# Patient Record
Sex: Female | Born: 2000 | Hispanic: Yes | Marital: Single | Smoking: Never smoker
Health system: Southern US, Community
[De-identification: ages and names within clinical notes are randomized; demographics above are authoritative.]

## PROBLEM LIST (undated history)

## (undated) DIAGNOSIS — D649 Anemia, unspecified: Secondary | ICD-10-CM

---

## 2022-04-14 ENCOUNTER — Emergency Department: Payer: Self-pay

## 2022-04-14 ENCOUNTER — Other Ambulatory Visit: Payer: Self-pay

## 2022-04-14 ENCOUNTER — Emergency Department
Admission: EM | Admit: 2022-04-14 | Discharge: 2022-04-14 | Disposition: A | Discharge status: Home / Self Care | Payer: Self-pay | Attending: Emergency Medicine | Admitting: Emergency Medicine

## 2022-04-14 DIAGNOSIS — O4690 Antepartum hemorrhage, unspecified, unspecified trimester: Secondary | ICD-10-CM

## 2022-04-14 DIAGNOSIS — R1030 Lower abdominal pain, unspecified: Secondary | ICD-10-CM | POA: Insufficient documentation

## 2022-04-14 DIAGNOSIS — M545 Low back pain, unspecified: Secondary | ICD-10-CM | POA: Insufficient documentation

## 2022-04-14 DIAGNOSIS — N898 Other specified noninflammatory disorders of vagina: Secondary | ICD-10-CM | POA: Insufficient documentation

## 2022-04-14 DIAGNOSIS — R8271 Bacteriuria: Secondary | ICD-10-CM

## 2022-04-14 DIAGNOSIS — Z3A01 Less than 8 weeks gestation of pregnancy: Secondary | ICD-10-CM | POA: Insufficient documentation

## 2022-04-14 DIAGNOSIS — O209 Hemorrhage in early pregnancy, unspecified: Secondary | ICD-10-CM | POA: Insufficient documentation

## 2022-04-14 LAB — CBC WITH DIFFERENTIAL/PLATELET
Abs Immature Granulocytes: 0.02 10*3/uL (ref 0.00–0.07)
Basophils Absolute: 0.1 10*3/uL (ref 0.0–0.1)
Basophils Relative: 1 %
Eosinophils Absolute: 0.1 10*3/uL (ref 0.0–0.5)
Eosinophils Relative: 1 %
HCT: 39.3 % (ref 36.0–46.0)
Hemoglobin: 12.8 g/dL (ref 12.0–15.0)
Immature Granulocytes: 0 %
Lymphocytes Relative: 28 %
Lymphs Abs: 2 10*3/uL (ref 0.7–4.0)
MCH: 28.2 pg (ref 26.0–34.0)
MCHC: 32.6 g/dL (ref 30.0–36.0)
MCV: 86.6 fL (ref 80.0–100.0)
Monocytes Absolute: 0.6 10*3/uL (ref 0.1–1.0)
Monocytes Relative: 8 %
Neutro Abs: 4.3 10*3/uL (ref 1.7–7.7)
Neutrophils Relative %: 62 %
Platelets: 283 10*3/uL (ref 150–400)
RBC: 4.54 MIL/uL (ref 3.87–5.11)
RDW: 13 % (ref 11.5–15.5)
WBC: 7 10*3/uL (ref 4.0–10.5)
nRBC: 0 % (ref 0.0–0.2)

## 2022-04-14 LAB — URINALYSIS, COMPLETE (UACMP) WITH MICROSCOPIC
Bilirubin Urine: NEGATIVE
Glucose, UA: NEGATIVE mg/dL
Ketones, ur: NEGATIVE mg/dL
Nitrite: NEGATIVE
Protein, ur: NEGATIVE mg/dL
Specific Gravity, Urine: 1.024 (ref 1.005–1.030)
pH: 6 (ref 5.0–8.0)

## 2022-04-14 LAB — BASIC METABOLIC PANEL
Anion gap: 8 (ref 5–15)
BUN: 10 mg/dL (ref 6–20)
CO2: 24 mmol/L (ref 22–32)
Calcium: 9.2 mg/dL (ref 8.9–10.3)
Chloride: 106 mmol/L (ref 98–111)
Creatinine, Ser: 0.56 mg/dL (ref 0.44–1.00)
GFR, Estimated: >60 mL/min (ref >60)
Glucose, Bld: 94 mg/dL (ref 70–99)
Potassium: 3.6 mmol/L (ref 3.5–5.1)
Sodium: 138 mmol/L (ref 135–145)

## 2022-04-14 LAB — ABO/RH: ABO/RH(D): B POS

## 2022-04-14 LAB — POC URINE PREG, ED: Preg Test, Ur: POSITIVE — AB

## 2022-04-14 LAB — CHLAMYDIA/NGC RT PCR (ARMC ONLY)
Chlamydia Tr: NOT DETECTED
N gonorrhoeae: NOT DETECTED

## 2022-04-14 LAB — HCG, QUANTITATIVE, PREGNANCY: hCG, Beta Chain, Quant, S: 3479 m[IU]/mL — ABNORMAL HIGH (ref <5)

## 2022-04-14 MED ORDER — CEPHALEXIN 500 MG PO CAPS: 500.0000 mg | ORAL_CAPSULE | Freq: Four times a day (QID) | ORAL | 0 refills | Status: AC

## 2022-04-14 NOTE — ED Provider Notes (Addendum)
[facility] Provider Note    Event Date/Time   First MD Initiated Contact with Patient 04/14/22 1213     (approximate)   History   Vaginal Bleeding   HPI  Theresa Solomon is a 21 y.o. female G1, P0 with last LMP on 511 who presents for evaluation of 1 week of some crampy lower abdominal pain and bilateral crampy low back pain associate with some intermittent bleeding occasionally spotting and occasionally passage of some clots.  Patient states she is not going through pads that often.  She denies any headache area, sore throat, fevers, chest pain, cough shortness of breath or any other abnormal vaginal discharge or burning with urination.  No history of any recent trauma or injuries.  She does not take any medications and denies any EtOH use with drug use or tobacco abuse.  She is declining any analgesia at this time.  She has not seen an OB/GYN for this pregnancy.      Physical Exam  Triage Vital Signs: ED Triage Vitals  Enc Vitals Group     BP 04/14/22 1150 112/77     Pulse Rate 04/14/22 1150 82     Resp 04/14/22 1150 16     Temp 04/14/22 1150 98.6 F (37 C)     Temp Source 04/14/22 1150 Oral     SpO2 04/14/22 1150 97 %     Weight 04/14/22 1147 130 lb (59 kg)     Height 04/14/22 1147 5\' 4"  (1.626 m)     Head Circumference --      Peak Flow --      Pain Score 04/14/22 1146 5     Pain Loc --      Pain Edu? --      Excl. in GC? --     Most recent vital signs: Vitals:   04/14/22 1150  BP: 112/77  Pulse: 82  Resp: 16  Temp: 98.6 F (37 C)  SpO2: 97%    General: Awake, no distress.  CV:  Good peripheral perfusion.  Resp:  Normal effort.  Abd:  No distention.  Soft throughout. Other:  No significant CVA tenderness.   ED Results / Procedures / Treatments  Labs (all labs ordered are listed, but only abnormal results are displayed) Labs Reviewed  URINALYSIS, COMPLETE (UACMP) WITH MICROSCOPIC - Abnormal; Notable for the following  components:      Result Value   Color, Urine YELLOW (*)    APPearance CLOUDY (*)    Hgb urine dipstick SMALL (*)    Leukocytes,Ua MODERATE (*)    Bacteria, UA RARE (*)    All other components within normal limits  HCG, QUANTITATIVE, PREGNANCY - Abnormal; Notable for the following components:   hCG, Beta Chain, Quant, S 3,479 (*)    All other components within normal limits  POC URINE PREG, ED - Abnormal; Notable for the following components:   Preg Test, Ur POSITIVE (*)    All other components within normal limits  CHLAMYDIA/NGC RT PCR ([facility] ONLY)            URINE CULTURE  CBC WITH DIFFERENTIAL/PLATELET  BASIC METABOLIC PANEL  ABO/RH     EKG    RADIOLOGY Pelvic ultrasound my interpretation shows a gestational sac with cannot clearly see a fetal pole or yolk sac.  I reviewed radiologist interpretation and agree their findings of a single gestational sac estimated gestational age of [redacted] weeks 2 days based on his sac diameter but  no fetal pole or yolk sac.  There is also notation of peripheral oriented follicles in both ovaries without other acute process i.e. evidence of subchorionic hemorrhage.  PROCEDURES:  Critical Care performed: No  Procedures    MEDICATIONS ORDERED IN ED: Medications - No data to display   IMPRESSION / MDM / ASSESSMENT AND PLAN / ED COURSE  I reviewed the triage vital signs and the nursing notes. Patient's presentation is most consistent with acute presentation with potential threat to life or bodily function.                               Differential diagnosis includes, but is not limited to miscarriage, subchorionic hemorrhage, ectopic pregnancy, possible cystitis.  CBC without leukocytosis or acute anemia.  BMP shows no significant electrolyte or metabolic derangements.  Rh+ and no indication for RhoGAM at this time.  UA has moderate leukocyte esterase and 11-20 WBCs with some rare bacteria noted.  Unclear if this represents possible cystitis as  there does appear to be some contamination with squamous epithelial cells as well.  hCG is 3479.  Urine GC studies are negative.  Pelvic ultrasound my interpretation shows a gestational sac with cannot clearly see a fetal pole or yolk sac.  I reviewed radiologist interpretation and agree their findings of a single gestational sac estimated gestational age of [redacted] weeks 2 days based on his sac diameter but no fetal pole or yolk sac.  There is also notation of peripheral oriented follicles in both ovaries without other acute process i.e. evidence of subchorionic hemorrhage.  I offered patient a pelvic exam prior to obtaining urine GC studies but patient is declining this stating she does not wish to get this at all in the emergency room even after explaining high sensitivity with swab for GC studies as well as trichomoniasis and yeast and BV.  She states she prefers to get this with her OB/GYN at her follow-up appointment.  Discussed that she has a somewhat indeterminate ultrasound at this time and that she must get repeat ultrasound and hCG levels repeated at 48 hours from now.  If she cannot get into see her OB/GYN who she is trying to get in an appointment with at [facility] she must return to emergency room.  She does not want a referral for a local OB/GYN.  We will treat her bacteria in her urine as possible asymptomatic bacteriuria urea pregnancy with some Keflex at this time.  She has no other acute concerns.  Given she is declining pelvic exam further testing will defer this to her OB.  Discharged in stable condition.  Strict return precautions advised and discussed.    FINAL CLINICAL IMPRESSION(S) / ED DIAGNOSES   Final diagnoses:  Vaginal bleeding in pregnancy  Bacteriuria     Rx / DC Orders   ED Discharge Orders          Ordered    cephALEXin (KEFLEX) 500 MG capsule  4 times daily        04/14/22 1443             Note:  This document was prepared using Dragon voice recognition software  and may include unintentional dictation errors.   Gilles Chiquito, MD 04/14/22 1445    Gilles Chiquito, MD 04/14/22 [phone]

## 2022-04-14 NOTE — ED Triage Notes (Signed)
Pt to ED with partner, pt complains if intermittent vaginal bleeding for 1 week, positive UA home test 2 weeks ago, LMP was 03/08/22. States bleeding is reddish-brown with flat quarter size clots. Also mild lower back pain and lower abdominal cramping since 1 week.

## 2022-04-14 NOTE — Discharge Instructions (Signed)
As we discussed it is vital you have repeat hCG levels checked and a repeat ultrasound in 48 hours.  Also as we discussed your gonorrhea and Chlamydia testing is in process and you can follow this result up on MyChart since you do not wish to wait for the result emergency room.  If it is positive you will require treatment and must return to the emergency room or be seen by your doctor.

## 2022-04-16 LAB — URINE CULTURE: Culture: 20000 — AB

## 2022-12-22 ENCOUNTER — Emergency Department: Payer: 59

## 2022-12-22 ENCOUNTER — Other Ambulatory Visit: Payer: Self-pay

## 2022-12-22 ENCOUNTER — Emergency Department
Admission: EM | Admit: 2022-12-22 | Discharge: 2022-12-22 | Disposition: A | Discharge status: Home / Self Care | Payer: 59 | Attending: Emergency Medicine | Admitting: Emergency Medicine

## 2022-12-22 DIAGNOSIS — D649 Anemia, unspecified: Secondary | ICD-10-CM | POA: Insufficient documentation

## 2022-12-22 DIAGNOSIS — K8051 Calculus of bile duct without cholangitis or cholecystitis with obstruction: Secondary | ICD-10-CM | POA: Diagnosis not present

## 2022-12-22 DIAGNOSIS — K8050 Calculus of bile duct without cholangitis or cholecystitis without obstruction: Secondary | ICD-10-CM

## 2022-12-22 DIAGNOSIS — R7401 Elevation of levels of liver transaminase levels: Secondary | ICD-10-CM | POA: Diagnosis not present

## 2022-12-22 DIAGNOSIS — R1013 Epigastric pain: Secondary | ICD-10-CM | POA: Diagnosis present

## 2022-12-22 LAB — CBC WITH DIFFERENTIAL/PLATELET
Abs Immature Granulocytes: 0.05 10*3/uL (ref 0.00–0.07)
Basophils Absolute: 0 10*3/uL (ref 0.0–0.1)
Basophils Relative: 0 %
Eosinophils Absolute: 0.1 10*3/uL (ref 0.0–0.5)
Eosinophils Relative: 1 %
HCT: 23.2 % — ABNORMAL LOW (ref 36.0–46.0)
Hemoglobin: 7.6 g/dL — ABNORMAL LOW (ref 12.0–15.0)
Immature Granulocytes: 0 %
Lymphocytes Relative: 11 %
Lymphs Abs: 1.3 10*3/uL (ref 0.7–4.0)
MCH: 28 pg (ref 26.0–34.0)
MCHC: 32.8 g/dL (ref 30.0–36.0)
MCV: 85.6 fL (ref 80.0–100.0)
Monocytes Absolute: 0.5 10*3/uL (ref 0.1–1.0)
Monocytes Relative: 5 %
Neutro Abs: 9.2 10*3/uL — ABNORMAL HIGH (ref 1.7–7.7)
Neutrophils Relative %: 83 %
Platelets: 222 10*3/uL (ref 150–400)
RBC: 2.71 MIL/uL — ABNORMAL LOW (ref 3.87–5.11)
RDW: 13.9 % (ref 11.5–15.5)
WBC: 11.2 10*3/uL — ABNORMAL HIGH (ref 4.0–10.5)
nRBC: 0 % (ref 0.0–0.2)

## 2022-12-22 LAB — COMPREHENSIVE METABOLIC PANEL
ALT: 41 U/L (ref 0–44)
AST: 88 U/L — ABNORMAL HIGH (ref 15–41)
Albumin: 2.6 g/dL — ABNORMAL LOW (ref 3.5–5.0)
Alkaline Phosphatase: 220 U/L — ABNORMAL HIGH (ref 38–126)
Anion gap: 9 (ref 5–15)
BUN: 7 mg/dL (ref 6–20)
CO2: 25 mmol/L (ref 22–32)
Calcium: 8.7 mg/dL — ABNORMAL LOW (ref 8.9–10.3)
Chloride: 105 mmol/L (ref 98–111)
Creatinine, Ser: 0.67 mg/dL (ref 0.44–1.00)
GFR, Estimated: >60 mL/min (ref >60)
Glucose, Bld: 114 mg/dL — ABNORMAL HIGH (ref 70–99)
Potassium: 3.5 mmol/L (ref 3.5–5.1)
Sodium: 139 mmol/L (ref 135–145)
Total Bilirubin: 1.1 mg/dL (ref 0.3–1.2)
Total Protein: 5.8 g/dL — ABNORMAL LOW (ref 6.5–8.1)

## 2022-12-22 LAB — LIPASE, BLOOD: Lipase: 30 U/L (ref 11–51)

## 2022-12-22 LAB — TROPONIN I (HIGH SENSITIVITY): Troponin I (High Sensitivity): 3 ng/L (ref <18)

## 2022-12-22 NOTE — ED Triage Notes (Signed)
Pt delivered baby on Wednesday and was discharged home Friday. Pt reports light vaginal bleeding with n/v onset this AM 0200. Pt alert and oriented following commands. Breathing unlabored with symmetric chest rise and fall.

## 2022-12-22 NOTE — ED Provider Notes (Signed)
[facility] Provider Note    None    (approximate)   History   Chief Complaint Abdominal Pain  HPI  Theresa Solomon is a 22 y.o. female with no significant past medical history who presents to the ED complaining of abdominal pain.  Patient underwent spontaneous vaginal delivery 3 days ago at [facility], which was complicated by vaginal bleeding and intermittent atony.  Hemoglobin stabilized following IV iron infusion, which was given as patient is a [employer].  Patient states that she did deal with some epigastric discomfort throughout her pregnancy, but this seemed to have resolved at the time of discharge yesterday.  Early this morning, she then developed recurrent pain in her epigastric area moving up into her chest.  This was associated with nausea and a couple episodes of vomiting.  She denies any pain in her lower abdomen and has not had any changes in her bowel movements.  She states that bleeding has been gradually improving since delivery, describes it as light spotting with no discharge.  She states she was told to return to the hospital if she has worsening pain and so presented to the ED this morning.     Physical Exam   Triage Vital Signs: ED Triage Vitals  Enc Vitals Group     BP      Pulse      Resp      Temp      Temp src      SpO2      Weight      Height      Head Circumference      Peak Flow      Pain Score      Pain Loc      Pain Edu?      Excl. in [place]?     Most recent vital signs: Vitals:   12/22/22 0323 12/22/22 0330  BP: 112/71 108/68  Pulse: 82 87  Resp: 18   Temp: 97.8 F (36.6 C)   SpO2: 99%     Constitutional: Alert and oriented. Eyes: Conjunctivae are normal. Head: Atraumatic. Nose: No congestion/rhinnorhea. Mouth/Throat: Mucous membranes are moist.  Cardiovascular: Normal rate, regular rhythm. Grossly normal heart sounds.  2+ radial pulses bilaterally. Respiratory: Normal respiratory effort.  No  retractions. Lungs CTAB. Gastrointestinal: Soft and nontender. No distention. Musculoskeletal: No lower extremity tenderness nor edema.  Neurologic:  Normal speech and language. No gross focal neurologic deficits are appreciated.    ED Results / Procedures / Treatments   Labs (all labs ordered are listed, but only abnormal results are displayed) Labs Reviewed  CBC WITH DIFFERENTIAL/PLATELET - Abnormal; Notable for the following components:      Result Value   WBC 11.2 (*)    RBC 2.71 (*)    Hemoglobin 7.6 (*)    HCT 23.2 (*)    Neutro Abs 9.2 (*)    All other components within normal limits  COMPREHENSIVE METABOLIC PANEL - Abnormal; Notable for the following components:   Glucose, Bld 114 (*)    Calcium 8.7 (*)    Total Protein 5.8 (*)    Albumin 2.6 (*)    AST 88 (*)    Alkaline Phosphatase 220 (*)    All other components within normal limits  LIPASE, BLOOD  TROPONIN I (HIGH SENSITIVITY)     EKG  ED ECG REPORT I, Blake Divine, the attending physician, personally viewed and interpreted this ECG.   Date: 12/22/2022  EKG Time:  3:51  Rate: 81  Rhythm: normal sinus rhythm  Axis: Normal  Intervals:none  ST&T Change: None  RADIOLOGY Right upper quadrant ultrasound reviewed and interpreted by me with gallstones, no pericholecystic fluid or gallbladder wall thickening noted.  PROCEDURES:  Critical Care performed: No  Procedures   MEDICATIONS ORDERED IN ED: Medications - No data to display   IMPRESSION / MDM / [place] / ED COURSE  I reviewed the triage vital signs and the nursing notes.                              22 y.o. female with history of spontaneous vaginal delivery 3 days ago complicated by vaginal bleeding who presents to the ED complaining of upper abdominal pain moving up into her chest with nausea and vomiting, states bleeding has improved.  Patient's presentation is most consistent with acute presentation with potential threat to  life or bodily function.  Differential diagnosis includes, but is not limited to, anemia, gastritis, GERD, pancreatitis, hepatitis, biliary colic, cholecystitis, ACS, PE, pneumonia, pneumothorax.  Patient well-appearing and in no acute distress, vital signs are unremarkable.  She has a completely benign abdominal exam, describes bleeding as improving with no discharge and I have low suspicion for retained products of conception at this time, especially given described abdominal pain is in her epigastric area and moving up into her chest.  With her chest discomfort, we will screen EKG, chest x-ray, and troponin but low suspicion for ACS or PE at this time.  Patient declines any pain or nausea medicine currently.  Cardiac workup is unremarkable, EKG shows no evidence of arrhythmia or ischemia, troponin within normal limits, chest x-ray also unremarkable.  Patient with relatively stable anemia, hemoglobin is 7.6 today compared to 8.0 when she left [facility].  With bleeding improving, patient would be appropriate for follow-up outpatient labs to ensure it does not continue to trend down.  No significant electrolyte abnormality or AKI noted, patient does have mild elevation in AST as well as alkaline phosphatase, but bilirubin remains normal.  Do not feel this represents HELLP syndrome given normal blood pressures, normal platelets, and normal bilirubin.  Right upper quadrant ultrasound performed and shows gallstones with no evidence of cholecystitis.  Her intermittent upper abdominal pain with vomiting could be explained by biliary colic and this may also explain her mild LFT abnormalities.  No evidence of biliary obstruction on ultrasound.  On reassessment, patient with no pain or nausea and she would be appropriate for outpatient follow-up with general surgery.  She was provided with referral and counseled to return to the ED for new or worsening symptoms.  Patient agrees with plan.      FINAL CLINICAL  IMPRESSION(S) / ED DIAGNOSES   Final diagnoses:  Biliary colic  Anemia, unspecified type     Rx / DC Orders   ED Discharge Orders     None        Note:  This document was prepared using Dragon voice recognition software and may include unintentional dictation errors.   Blake Divine, MD 12/22/22 [phone]

## 2023-01-02 ENCOUNTER — Ambulatory Visit: Payer: Self-pay | Admitting: Surgery

## 2023-01-08 NOTE — Progress Notes (Signed)
Patient ID: Theresa Solomon, female   DOB: 2000/12/09, 22 y.o.   MRN: [id number]  Chief Complaint: Right upper quadrant pain  History of Present Illness Theresa Solomon is a 22 y.o. female with intermittent sporadic right upper quadrant pain, seemingly postprandial.  Patient typically avoids fried foods and spicy foods.  Reports nausea without vomiting.  Denies any history of jaundice or hepatitis.  1 episode right upper quadrant pain prolonged in nature, precipitating workup of ultrasound which confirmed gallstones.  Past Medical History No past medical history on file.    No past surgical history on file.  No Known Allergies  No current outpatient medications on file.   No current facility-administered medications for this visit.    Family History No family history on file.    Social History Social History   Tobacco Use   Smoking status: Never    Passive exposure: Never   Smokeless tobacco: Never  Vaping Use   Vaping Use: Never used  Substance Use Topics   Drug use: Never        Review of Systems  Constitutional: Negative.   HENT: Negative.    Eyes: Negative.   Respiratory: Negative.    Cardiovascular: Negative.   Gastrointestinal:  Positive for abdominal pain and nausea. Negative for blood in stool and melena.  Genitourinary: Negative.   Musculoskeletal:  Negative for joint pain.  Skin: Negative.   Neurological: Negative.   Psychiatric/Behavioral: Negative.       Physical Exam Blood pressure 109/60, pulse 75, temperature 98 F (36.7 C), height 5\' 5"  (1.651 m), weight 166 lb (75.3 kg), last menstrual period 03/08/2022, SpO2 97 %. Last Weight  Most recent update: 01/10/2023  2:16 PM    Weight  75.3 kg (166 lb)             CONSTITUTIONAL: Well developed, and nourished, appropriately responsive and aware without distress.   EYES: Sclera non-icteric.   EARS, NOSE, MOUTH AND THROAT:  The oropharynx is clear. Oral mucosa is pink and moist.     Hearing is intact to voice.  NECK: Trachea is midline, and there is no jugular venous distension.  LYMPH NODES:  Lymph nodes in the neck are not appreciated. RESPIRATORY:  Lungs are clear, and breath sounds are equal bilaterally.  Normal respiratory effort without pathologic use of accessory muscles. CARDIOVASCULAR: Heart is regular in rate and rhythm.  Well perfused.  GI: The abdomen is  soft, nontender, and nondistended. There were no palpable masses.  I did not appreciate hepatosplenomegaly. MUSCULOSKELETAL:  Symmetrical muscle tone appreciated in all four extremities.    SKIN: Skin turgor is normal. No pathologic skin lesions appreciated.  NEUROLOGIC:  Motor and sensation appear grossly normal.  Cranial nerves are grossly without defect. PSYCH:  Alert and oriented to person, place and time. Affect is appropriate for situation.  Data Reviewed I have personally reviewed what is currently available of the patient's imaging, recent labs and medical records.   Labs:     Latest Ref Rng & Units 12/22/2022    4:07 AM 04/14/2022   12:33 PM  CBC  WBC 4.0 - 10.5 K/uL 11.2  7.0   Hemoglobin 12.0 - 15.0 g/dL 7.6  12.8   Hematocrit 36.0 - 46.0 % 23.2  39.3   Platelets 150 - 400 K/uL 222  283       Latest Ref Rng & Units 12/22/2022    4:07 AM 04/14/2022   12:33 PM  CMP  Glucose 70 -  99 mg/dL 114  94   BUN 6 - 20 mg/dL 7  10   Creatinine 0.44 - 1.00 mg/dL 0.67  0.56   Sodium 135 - 145 mmol/L 139  138   Potassium 3.5 - 5.1 mmol/L 3.5  3.6   Chloride 98 - 111 mmol/L 105  106   CO2 22 - 32 mmol/L 25  24   Calcium 8.9 - 10.3 mg/dL 8.7  9.2   Total Protein 6.5 - 8.1 g/dL 5.8    Total Bilirubin 0.3 - 1.2 mg/dL 1.1    Alkaline Phos 38 - 126 U/L 220    AST 15 - 41 U/L 88    ALT 0 - 44 U/L 41        Imaging: Radiological images reviewed:  CLINICAL DATA:  22 year old female with epigastric pain.   EXAM: ULTRASOUND ABDOMEN LIMITED RIGHT UPPER QUADRANT   COMPARISON:  None Available.    FINDINGS: Gallbladder:   Small shadowing echogenic gallstones (series 1, image 3), individual stone size estimated at 5-6 mm. Gallbladder wall thickness remains normal. No pericholecystic fluid. No sonographic Murphy sign elicited.   Common bile duct:   Diameter: 3 mm, normal.   Liver:   No intrahepatic biliary ductal dilatation identified. Liver echogenicity is within normal limits. Portal vein is patent on color Doppler imaging with normal direction of blood flow towards the liver.   Other: Negative visible right kidney.  No free fluid.   IMPRESSION: 1. Numerous small gallstones, but no evidence of acute cholecystitis or bile duct obstruction. 2. Normal ultrasound appearance of the liver.     Electronically Signed   By: Genevie Ann M.D.   On: 12/22/2022 05:21 Within last 24 hrs: No results found.  Assessment    Chronic calculus cholecystitis with biliary colic  There are no problems to display for this patient.   Plan    Robotic cholecystectomy with ICG imaging This was discussed thoroughly.  Optimal plan is for robotic cholecystectomy utilizing ICG imaging. Risks and benefits have been discussed with the patient which include but are not limited to anesthesia, bleeding, infection, biliary ductal injury, resulting in leak or stenosis, other associated unanticipated injuries affiliated with laparoscopic surgery.   Reviewed that removing the gallbladder will only address the symptoms related to the gallbladder itself.  I believe there is the desire to proceed, accepting the risks with understanding.  Questions elicited and answered to satisfaction.    No guarantees ever expressed or implied.   Face-to-face time spent with the patient and accompanying care providers(if present) was 30 minutes, with more than 50% of the time spent counseling, educating, and coordinating care of the patient.    These notes generated with voice recognition software. I apologize for  typographical errors.  Ronny Bacon M.D., FACS 01/11/2023, 1:58 PM

## 2023-01-10 ENCOUNTER — Ambulatory Visit (INDEPENDENT_AMBULATORY_CARE_PROVIDER_SITE_OTHER): Payer: 59 | Admitting: Surgery

## 2023-01-10 ENCOUNTER — Encounter: Payer: Self-pay | Admitting: Surgery

## 2023-01-10 VITALS — BP 109/60 | HR 75 | Temp 98.0°F | Ht 65.0 in | Wt 166.0 lb

## 2023-01-10 DIAGNOSIS — K8010 Calculus of gallbladder with chronic cholecystitis without obstruction: Secondary | ICD-10-CM | POA: Diagnosis not present

## 2023-01-10 NOTE — Patient Instructions (Signed)
You have requested to have your gallbladder removed. This will be done at [facility] with Dr. Rodenberg.  You will most likely be out of work 1-2 weeks for this surgery.  If you have FMLA or disability paperwork that needs filled out you may drop this off at our office or this can be faxed to [phone].  You will return after your post-op appointment with a lifting restriction for approximately 4 more weeks.  You will be able to eat anything you would like to following surgery. But, start by eating a bland diet and advance this as tolerated. The Gallbladder diet is below, please go as closely by this diet as possible prior to surgery to avoid any further attacks.  Please see the (blue)pre-care form that you have been given today. Our surgery scheduler will call you to verify surgery date and to go over information.   If you have any questions, please call our office.  Laparoscopic Cholecystectomy Laparoscopic cholecystectomy is surgery to remove the gallbladder. The gallbladder is located in the upper right part of the abdomen, behind the liver. It is a storage sac for bile, which is produced in the liver. Bile aids in the digestion and absorption of fats. Cholecystectomy is often done for inflammation of the gallbladder (cholecystitis). This condition is usually caused by a buildup of gallstones (cholelithiasis) in the gallbladder. Gallstones can block the flow of bile, and that can result in inflammation and pain. In severe cases, emergency surgery may be required. If emergency surgery is not required, you will have time to prepare for the procedure. Laparoscopic surgery is an alternative to open surgery. Laparoscopic surgery has a shorter recovery time. Your common bile duct may also need to be examined during the procedure. If stones are found in the common bile duct, they may be removed. LET YOUR HEALTH CARE PROVIDER KNOW ABOUT: Any allergies you have. All medicines you are taking,  including vitamins, herbs, eye drops, creams, and over-the-counter medicines. Previous problems you or members of your family have had with the use of anesthetics. Any blood disorders you have. Previous surgeries you have had.  Any medical conditions you have. RISKS AND COMPLICATIONS Generally, this is a safe procedure. However, problems may occur, including: Infection. Bleeding. Allergic reactions to medicines. Damage to other structures or organs. A stone remaining in the common bile duct. A bile leak from the cyst duct that is clipped when your gallbladder is removed. The need to convert to open surgery, which requires a larger incision in the abdomen. This may be necessary if your surgeon thinks that it is not safe to continue with a laparoscopic procedure. BEFORE THE PROCEDURE Ask your health care provider about: Changing or stopping your regular medicines. This is especially important if you are taking diabetes medicines or blood thinners. Taking medicines such as aspirin and ibuprofen. These medicines can thin your blood. Do not take these medicines before your procedure if your health care provider instructs you not to. Follow instructions from your health care provider about eating or drinking restrictions. Let your health care provider know if you develop a cold or an infection before surgery. Plan to have someone take you home after the procedure. Ask your health care provider how your surgical site will be marked or identified. You may be given antibiotic medicine to help prevent infection. PROCEDURE To reduce your risk of infection: Your health care team will wash or sanitize their hands. Your skin will be washed with soap. An IV   tube may be inserted into one of your veins. You will be given a medicine to make you fall asleep (general anesthetic). A breathing tube will be placed in your mouth. The surgeon will make several small cuts (incisions) in your abdomen. A thin,  lighted tube (laparoscope) that has a tiny camera on the end will be inserted through one of the small incisions. The camera on the laparoscope will send a picture to a TV screen (monitor) in the operating room. This will give the surgeon a good view inside your abdomen. A gas will be pumped into your abdomen. This will expand your abdomen to give the surgeon more room to perform the surgery. Other tools that are needed for the procedure will be inserted through the other incisions. The gallbladder will be removed through one of the incisions. After your gallbladder has been removed, the incisions will be closed with stitches (sutures), staples, or skin glue. Your incisions may be covered with a bandage (dressing). The procedure may vary among health care providers and hospitals. AFTER THE PROCEDURE Your blood pressure, heart rate, breathing rate, and blood oxygen level will be monitored often until the medicines you were given have worn off. You will be given medicines as needed to control your pain.   This information is not intended to replace advice given to you by your health care provider. Make sure you discuss any questions you have with your health care provider.   Document Released: 10/15/2005 Document Revised: 07/06/2015 Document Reviewed: 05/27/2013 Elsevier Interactive Patient Education 2016 [employer]   Low-Fat Diet for Gallbladder Conditions A low-fat diet can be helpful if you have pancreatitis or a gallbladder condition. With these conditions, your pancreas and gallbladder have trouble digesting fats. A healthy eating plan with less fat will help rest your pancreas and gallbladder and reduce your symptoms. WHAT DO I NEED TO KNOW ABOUT THIS DIET? Eat a low-fat diet. Reduce your fat intake to less than 20-30% of your total daily calories. This is less than 50-60 g of fat per day. Remember that you need some fat in your diet. Ask your dietician what your daily goal should  be. Choose nonfat and low-fat healthy foods. Look for the words "nonfat," "low fat," or "fat free." As a guide, look on the label and choose foods with less than 3 g of fat per serving. Eat only one serving. Avoid alcohol. Do not smoke. If you need help quitting, talk with your health care provider. Eat small frequent meals instead of three large heavy meals. WHAT FOODS CAN I EAT? Grains Include healthy grains and starches such as potatoes, wheat bread, fiber-rich cereal, and brown rice. Choose whole grain options whenever possible. In adults, whole grains should account for 45-65% of your daily calories.  Fruits and Vegetables Eat plenty of fruits and vegetables. Fresh fruits and vegetables add fiber to your diet. Meats and Other Protein Sources Eat lean meat such as chicken and pork. Trim any fat off of meat before cooking it. Eggs, fish, and beans are other sources of protein. In adults, these foods should account for 10-35% of your daily calories. Dairy Choose low-fat milk and dairy options. Dairy includes fat and protein, as well as calcium.  Fats and Oils Limit high-fat foods such as fried foods, sweets, baked goods, sugary drinks.  Other Creamy sauces and condiments, such as mayonnaise, can add extra fat. Think about whether or not you need to use them, or use smaller amounts or low fat options.   WHAT FOODS ARE NOT RECOMMENDED? High fat foods, such as: Baked goods. Ice cream. French toast. Sweet rolls. Pizza. Cheese bread. Foods covered with batter, butter, creamy sauces, or cheese. Fried foods. Sugary drinks and desserts. Foods that cause gas or bloating   This information is not intended to replace advice given to you by your health care provider. Make sure you discuss any questions you have with your health care provider.   Document Released: 10/20/2013 Document Reviewed: 10/20/2013 Elsevier Interactive Patient Education 2016 Elsevier Inc.   

## 2023-01-11 ENCOUNTER — Telehealth: Payer: Self-pay | Admitting: Surgery

## 2023-01-11 ENCOUNTER — Ambulatory Visit: Payer: Self-pay | Admitting: Surgery

## 2023-01-11 DIAGNOSIS — K8010 Calculus of gallbladder with chronic cholecystitis without obstruction: Secondary | ICD-10-CM

## 2023-01-11 NOTE — Telephone Encounter (Signed)
Left message for patient to call, please inform her of the following regarding scheduled surgery with Dr. Christian Mate.   Pre-Admission date/time, and Surgery date at [facility].  Surgery Date: 01/25/23 Preadmission Testing Date: 01/17/23 (phone 1p-4p)  Patient has been made aware to call [phone], between 1-3:00pm the day before surgery, to find out what time to arrive for surgery.

## 2023-01-11 NOTE — Telephone Encounter (Signed)
Patient calls back, she is now informed of all dates regarding surgery.  

## 2023-01-17 ENCOUNTER — Other Ambulatory Visit: Payer: Self-pay

## 2023-01-17 ENCOUNTER — Encounter
Admission: RE | Admit: 2023-01-17 | Discharge: 2023-01-17 | Disposition: A | Discharge status: Home / Self Care | Payer: 59 | Source: Ambulatory Visit | Attending: Surgery | Admitting: Surgery

## 2023-01-17 VITALS — Ht 65.0 in | Wt 168.0 lb

## 2023-01-17 DIAGNOSIS — Z01818 Encounter for other preprocedural examination: Secondary | ICD-10-CM

## 2023-01-17 DIAGNOSIS — D649 Anemia, unspecified: Secondary | ICD-10-CM

## 2023-01-17 HISTORY — DX: Anemia, unspecified: D64.9

## 2023-01-17 NOTE — Patient Instructions (Signed)
Your procedure is scheduled on: Friday 01/25/23 To find out your arrival time, please call [phone] between [place] on:   Thursday 01/24/23 Report to the Registration Desk on the 1st floor of the [facility]. Valet parking is available.  If your arrival time is 6:00 am, do not arrive before that time as the [place] entrance doors do not open until 6:00 am.  REMEMBER: Instructions that are not followed completely may result in serious medical risk, up to and including death; or upon the discretion of your surgeon and anesthesiologist your surgery may need to be rescheduled.  Do not eat food or drink any liquids after midnight the night before surgery.  No gum chewing or hard candies.  One week prior to surgery: Stop Anti-inflammatories (NSAIDS) such as Advil, Aleve, Ibuprofen, Motrin, Naproxen, Naprosyn and Aspirin based products such as Excedrin, Goody's Powder, BC Powder. You may however, continue to take Tylenol if needed for pain up until the day of surgery.  Stop ANY OVER THE COUNTER supplements until after surgery. Continue taking your iron gummies until Thursday 01/24/23, hold the day of surgery then restart when you get home.  Continue taking all prescribed medications.   TAKE ONLY THESE MEDICATIONS THE MORNING OF SURGERY WITH A SIP OF WATER:  none  No Alcohol for 24 hours before or after surgery.  No Smoking including e-cigarettes for 24 hours before surgery.  No chewable tobacco products for at least 6 hours before surgery.  No nicotine patches on the day of surgery.  Do not use any "recreational" drugs for at least a week (preferably 2 weeks) before your surgery.  Please be advised that the combination of cocaine and anesthesia may have negative outcomes, up to and including death. If you test positive for cocaine, your surgery will be cancelled.  On the morning of surgery brush your teeth with toothpaste and water, you may rinse your mouth with mouthwash if  you wish. Do not swallow any toothpaste or mouthwash.  Use CHG Soap or wipes as directed on instruction sheet.  Do not wear lotions, powders, or perfumes.   Do not shave body hair from the neck down 48 hours before surgery.  Wear comfortable clothing (specific to your surgery type) to the hospital.  Do not wear jewelry, make-up, hairpins, clips or nail polish.  Contact lenses, hearing aids and dentures may not be worn into surgery.  Do not bring valuables to the hospital. [facility] is not responsible for any missing/lost belongings or valuables.   Notify your doctor if there is any change in your medical condition (cold, fever, infection).  If you are being discharged the day of surgery, you will not be allowed to drive home. You will need a responsible individual to drive you home and stay with you for 24 hours after surgery.   If you are taking public transportation, you will need to have a responsible individual with you.  If you are being admitted to the hospital overnight, leave your suitcase in the car. After surgery it may be brought to your room.  In case of increased patient census, it may be necessary for you, the patient, to continue your postoperative care in the Same Day Surgery department.  After surgery, you can help prevent lung complications by doing breathing exercises.  Take deep breaths and cough every 1-2 hours. Your doctor may order a device called an Incentive Spirometer to help you take deep breaths. When coughing or sneezing, hold a  pillow firmly against your incision with both hands. This is called "splinting." Doing this helps protect your incision. It also decreases belly discomfort.  Surgery Visitation Policy:  Patients undergoing a surgery or procedure may have two family members or support persons with them as long as the person is not COVID-19 positive or experiencing its symptoms.   Inpatient Visitation:    Visiting hours are 7 a.m. to 8  p.m. Up to four visitors are allowed at one time in a patient room. The visitors may rotate out with other people during the day. One designated support person (adult) may remain overnight.  Please call the [facility]. at [phone] if you have any questions about these instructions.     Preparing for Surgery with CHLORHEXIDINE GLUCONATE (CHG) Soap  Chlorhexidine Gluconate (CHG) Soap  o An antiseptic cleaner that kills germs and bonds with the skin to continue killing germs even after washing  o Used for showering the night before surgery and morning of surgery  Before surgery, you can play an important role by reducing the number of germs on your skin.  CHG (Chlorhexidine gluconate) soap is an antiseptic cleanser which kills germs and bonds with the skin to continue killing germs even after washing.  Please do not use if you have an allergy to CHG or antibacterial soaps. If your skin becomes reddened/irritated stop using the CHG.  1. Shower the NIGHT BEFORE SURGERY and the MORNING OF SURGERY with CHG soap.  2. If you choose to wash your hair, wash your hair first as usual with your normal shampoo.  3. After shampooing, rinse your hair and body thoroughly to remove the shampoo.  4. Use CHG as you would any other liquid soap. You can apply CHG directly to the skin and wash gently with a scrungie or a clean washcloth.  5. Apply the CHG soap to your body only from the neck down. Do not use on open wounds or open sores. Avoid contact with your eyes, ears, mouth, and genitals (private parts). Wash face and genitals (private parts) with your normal soap.  6. Wash thoroughly, paying special attention to the area where your surgery will be performed.  7. Thoroughly rinse your body with warm water.  8. Do not shower/wash with your normal soap after using and rinsing off the CHG soap.  9. Pat yourself dry with a clean towel.  10. Wear clean pajamas to bed the night  before surgery.  12. Place clean sheets on your bed the night of your first shower and do not sleep with pets.  13. Shower again with the CHG soap on the day of surgery prior to arriving at the hospital.  14. Do not apply any deodorants/lotions/powders.  15. Please wear clean clothes to the hospital.

## 2023-01-20 ENCOUNTER — Emergency Department: Payer: 59

## 2023-01-20 ENCOUNTER — Other Ambulatory Visit: Payer: Self-pay

## 2023-01-20 ENCOUNTER — Emergency Department
Admission: EM | Admit: 2023-01-20 | Discharge: 2023-01-20 | Disposition: A | Discharge status: Home / Self Care | Payer: 59 | Attending: Emergency Medicine | Admitting: Emergency Medicine

## 2023-01-20 DIAGNOSIS — K8050 Calculus of bile duct without cholangitis or cholecystitis without obstruction: Secondary | ICD-10-CM | POA: Insufficient documentation

## 2023-01-20 DIAGNOSIS — R1011 Right upper quadrant pain: Secondary | ICD-10-CM | POA: Diagnosis present

## 2023-01-20 LAB — POC URINE PREG, ED: Preg Test, Ur: NEGATIVE

## 2023-01-20 LAB — COMPREHENSIVE METABOLIC PANEL
ALT: 26 U/L (ref 0–44)
AST: 48 U/L — ABNORMAL HIGH (ref 15–41)
Albumin: 4.2 g/dL (ref 3.5–5.0)
Alkaline Phosphatase: 107 U/L (ref 38–126)
Anion gap: 8 (ref 5–15)
BUN: 12 mg/dL (ref 6–20)
CO2: 26 mmol/L (ref 22–32)
Calcium: 9.1 mg/dL (ref 8.9–10.3)
Chloride: 105 mmol/L (ref 98–111)
Creatinine, Ser: 0.75 mg/dL (ref 0.44–1.00)
GFR, Estimated: >60 mL/min (ref >60)
Glucose, Bld: 123 mg/dL — ABNORMAL HIGH (ref 70–99)
Potassium: 3.6 mmol/L (ref 3.5–5.1)
Sodium: 139 mmol/L (ref 135–145)
Total Bilirubin: 0.6 mg/dL (ref 0.3–1.2)
Total Protein: 7.9 g/dL (ref 6.5–8.1)

## 2023-01-20 LAB — URINALYSIS, ROUTINE W REFLEX MICROSCOPIC
Bilirubin Urine: NEGATIVE
Glucose, UA: NEGATIVE mg/dL
Ketones, ur: NEGATIVE mg/dL
Nitrite: NEGATIVE
Protein, ur: NEGATIVE mg/dL
Specific Gravity, Urine: 1.014 (ref 1.005–1.030)
pH: 7 (ref 5.0–8.0)

## 2023-01-20 LAB — CBC
HCT: 40.1 % (ref 36.0–46.0)
Hemoglobin: 12.7 g/dL (ref 12.0–15.0)
MCH: 27.9 pg (ref 26.0–34.0)
MCHC: 31.7 g/dL (ref 30.0–36.0)
MCV: 87.9 fL (ref 80.0–100.0)
Platelets: 243 10*3/uL (ref 150–400)
RBC: 4.56 MIL/uL (ref 3.87–5.11)
RDW: 14 % (ref 11.5–15.5)
WBC: 11.8 10*3/uL — ABNORMAL HIGH (ref 4.0–10.5)
nRBC: 0 % (ref 0.0–0.2)

## 2023-01-20 LAB — LIPASE, BLOOD: Lipase: 29 U/L (ref 11–51)

## 2023-01-20 MED ORDER — ONDANSETRON HCL 4 MG/2ML IJ SOLN
4.0000 mg | Freq: Once | INTRAMUSCULAR | Status: DC
  Filled 2023-01-20: qty 2

## 2023-01-20 MED ORDER — MORPHINE SULFATE (PF) 4 MG/ML IV SOLN
4.0000 mg | Freq: Once | INTRAVENOUS | Status: DC
  Filled 2023-01-20: qty 1

## 2023-01-20 MED ORDER — OXYCODONE HCL 5 MG/5ML PO SOLN: 5.0000 mg | Freq: Four times a day (QID) | ORAL | 0 refills | Status: AC | PRN

## 2023-01-20 MED ORDER — LACTATED RINGERS IV BOLUS
1000.0000 mL | Freq: Once | INTRAVENOUS | Status: AC
  Administered 2023-01-20: 1000 mL via INTRAVENOUS

## 2023-01-20 MED ORDER — ONDANSETRON 4 MG PO TBDP: 4.0000 mg | ORAL_TABLET | Freq: Three times a day (TID) | ORAL | 0 refills | Status: AC | PRN

## 2023-01-20 NOTE — ED Triage Notes (Signed)
Pt to ED from home for abdominal pain. Pt has gallstones that were diagnosed recently and has scheduled surgery for 3/29. Pt states she is unable to bear the pain anymore. Pt has been vomiting all day. Pt is CAOx4 and ambulatory in triage.

## 2023-01-20 NOTE — ED Provider Notes (Signed)
[facility] Provider Note    Event Date/Time   First MD Initiated Contact with Patient 01/20/23 [phone]     (approximate)   History   Chief Complaint Abdominal Pain (Gallstones - surgery scheduled for 3/29)   HPI  Theresa Solomon is a 22 y.o. female with past medical history of cholelithiasis who presents to the ED complaining of abdominal pain.  Patient reports that she has been dealing with intermittent episodes of right upper quadrant abdominal pain for the past month, currently has surgery scheduled to have her gallbladder removed in 6 days.  She states the pain became severe earlier this evening associated with nausea and multiple episodes of vomiting.  She denies any associated diarrhea and has not had any dysuria, hematuria, or flank pain.  She reports objective fevers and chills, but has not taken her temperature.  She has not taken anything for pain or nausea prior to arrival.     Physical Exam   Triage Vital Signs: ED Triage Vitals  Enc Vitals Group     BP 01/20/23 0219 135/86     Pulse Rate 01/20/23 0219 70     Resp 01/20/23 0219 16     Temp 01/20/23 0219 98 F (36.7 C)     Temp Source 01/20/23 0219 Oral     SpO2 01/20/23 0219 98 %     Weight 01/20/23 0220 167 lb 8.8 oz (76 kg)     Height 01/20/23 0220 5\' 5"  (1.651 m)     Head Circumference --      Peak Flow --      Pain Score 01/20/23 0219 6     Pain Loc --      Pain Edu? --      Excl. in [place]? --     Most recent vital signs: Vitals:   01/20/23 0219  BP: 135/86  Pulse: 70  Resp: 16  Temp: 98 F (36.7 C)  SpO2: 98%    Constitutional: Alert and oriented. Eyes: Conjunctivae are normal. Head: Atraumatic. Nose: No congestion/rhinnorhea. Mouth/Throat: Mucous membranes are moist.  Cardiovascular: Normal rate, regular rhythm. Grossly normal heart sounds.  2+ radial pulses bilaterally. Respiratory: Normal respiratory effort.  No retractions. Lungs CTAB. Gastrointestinal: Soft and  tender to palpation in the right upper quadrant with no rebound or guarding. No distention. Musculoskeletal: No lower extremity tenderness nor edema.  Neurologic:  Normal speech and language. No gross focal neurologic deficits are appreciated.    ED Results / Procedures / Treatments   Labs (all labs ordered are listed, but only abnormal results are displayed) Labs Reviewed  COMPREHENSIVE METABOLIC PANEL - Abnormal; Notable for the following components:      Result Value   Glucose, Bld 123 (*)    AST 48 (*)    All other components within normal limits  CBC - Abnormal; Notable for the following components:   WBC 11.8 (*)    All other components within normal limits  URINALYSIS, ROUTINE W REFLEX MICROSCOPIC - Abnormal; Notable for the following components:   Color, Urine YELLOW (*)    APPearance CLEAR (*)    Hgb urine dipstick SMALL (*)    Leukocytes,Ua SMALL (*)    Bacteria, UA RARE (*)    All other components within normal limits  LIPASE, BLOOD  POC URINE PREG, ED   RADIOLOGY Right upper quadrant ultrasound reviewed and interpreted by me with cholelithiasis, no evidence of cholecystitis.  PROCEDURES:  Critical Care performed: No  Procedures  MEDICATIONS ORDERED IN ED: Medications  morphine (PF) 4 MG/ML injection 4 mg (4 mg Intravenous Patient Refused/Not Given 01/20/23 0357)  ondansetron (ZOFRAN) injection 4 mg (4 mg Intravenous Patient Refused/Not Given 01/20/23 0357)  lactated ringers bolus 1,000 mL (1,000 mLs Intravenous New Bag/Given 01/20/23 0245)     IMPRESSION / MDM / ASSESSMENT AND PLAN / ED COURSE  I reviewed the triage vital signs and the nursing notes.                              22 y.o. female with past medical history of cholelithiasis who presents to the ED with increasing right upper quadrant abdominal pain since 11 PM this evening associated with nausea and vomiting.  Patient's presentation is most consistent with acute presentation with potential  threat to life or bodily function.  Differential diagnosis includes, but is not limited to, cholecystitis, cholelithiasis, pancreatitis, hepatitis, electrolyte abnormality, AKI, UTI.  Patient nontoxic-appearing and in no acute distress, vital signs are unremarkable.  Pain is reproducible with palpation of the right upper quadrant, we will further assess with repeat right upper quadrant ultrasound to ensure no developing cholecystitis.  Labs are reassuring with no significant anemia, leukocytosis, electrolyte abnormality, or AKI.  LFTs and lipase are unremarkable, urinalysis pending at this time.  We will treat symptomatically with IV morphine and Zofran, hydrate with IV fluids and reassess.  Right upper quadrant ultrasound shows cholelithiasis with no evidence of cholecystitis, pregnancy testing is negative and urinalysis shows no signs of infection.  Patient refused pain and nausea medication here in the ED, states that symptoms are resolving.  She is appropriate for discharge home, will be prescribed ODT Zofran and liquid oxycodone as she states she is unable to tolerate pills.  She was counseled to follow-up with her surgeon and to return to the ED for new or worsening symptoms, patient agrees with plan.      FINAL CLINICAL IMPRESSION(S) / ED DIAGNOSES   Final diagnoses:  Biliary colic     Rx / DC Orders   ED Discharge Orders          Ordered    oxyCODONE (ROXICODONE) 5 MG/5ML solution  Every 6 hours PRN        01/20/23 0429    ondansetron (ZOFRAN-ODT) 4 MG disintegrating tablet  Every 8 hours PRN        01/20/23 0429             Note:  This document was prepared using Dragon voice recognition software and may include unintentional dictation errors.   Blake Divine, MD 01/20/23 0430

## 2023-01-21 ENCOUNTER — Ambulatory Visit: Payer: Self-pay | Admitting: Surgery

## 2023-01-21 ENCOUNTER — Encounter
Admission: RE | Admit: 2023-01-21 | Discharge: 2023-01-21 | Disposition: A | Discharge status: Home / Self Care | Payer: 59 | Source: Ambulatory Visit | Attending: Surgery | Admitting: Surgery

## 2023-01-21 DIAGNOSIS — D649 Anemia, unspecified: Secondary | ICD-10-CM | POA: Diagnosis not present

## 2023-01-21 DIAGNOSIS — Z01812 Encounter for preprocedural laboratory examination: Secondary | ICD-10-CM | POA: Insufficient documentation

## 2023-01-21 LAB — CBC
HCT: 35.3 % — ABNORMAL LOW (ref 36.0–46.0)
Hemoglobin: 11.4 g/dL — ABNORMAL LOW (ref 12.0–15.0)
MCH: 28.2 pg (ref 26.0–34.0)
MCHC: 32.3 g/dL (ref 30.0–36.0)
MCV: 87.4 fL (ref 80.0–100.0)
Platelets: 202 10*3/uL (ref 150–400)
RBC: 4.04 MIL/uL (ref 3.87–5.11)
RDW: 14 % (ref 11.5–15.5)
WBC: 5.6 10*3/uL (ref 4.0–10.5)
nRBC: 0 % (ref 0.0–0.2)

## 2023-01-25 ENCOUNTER — Ambulatory Visit: Admission: RE | Admit: 2023-01-25 | Payer: 59 | Source: Ambulatory Visit | Admitting: Surgery

## 2023-01-25 ENCOUNTER — Encounter: Admission: RE | Payer: Self-pay | Source: Ambulatory Visit

## 2023-01-25 SURGERY — CHOLECYSTECTOMY, ROBOT-ASSISTED, LAPAROSCOPIC
Anesthesia: General

## 2024-03-08 IMAGING — US US OB < 14 WEEKS - US OB TV
1 series · 13 of 28 positions shown · non-contrast
Comparison: None Available.

CLINICAL DATA: First trimester pregnancy.  Bleeding for 1 week.

EXAM:
OBSTETRIC <14 WK US AND TRANSVAGINAL OB US
TECHNIQUE: Both transabdominal and transvaginal ultrasound examinations were
performed for complete evaluation of the gestation as well as the
maternal uterus, adnexal regions, and pelvic cul-de-sac.
Transvaginal technique was performed to assess early pregnancy.

[Series 1: us ob comp less 14 wks · 13 of 41 slices shown]
[im 2/41]
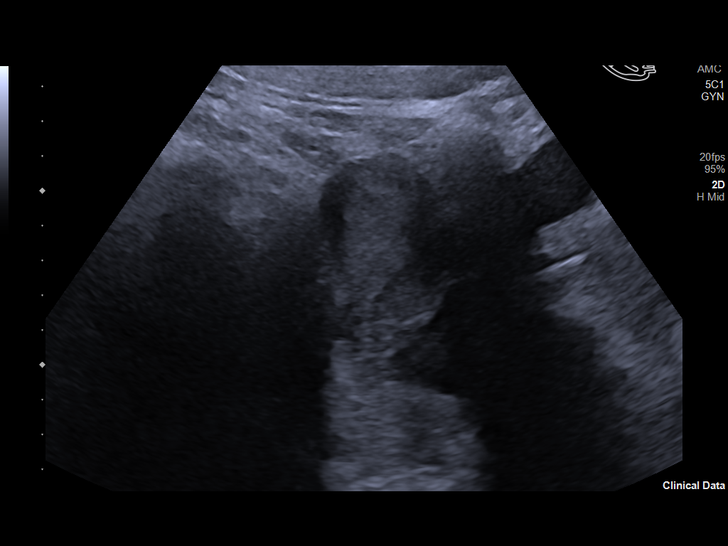
[im 5/41]
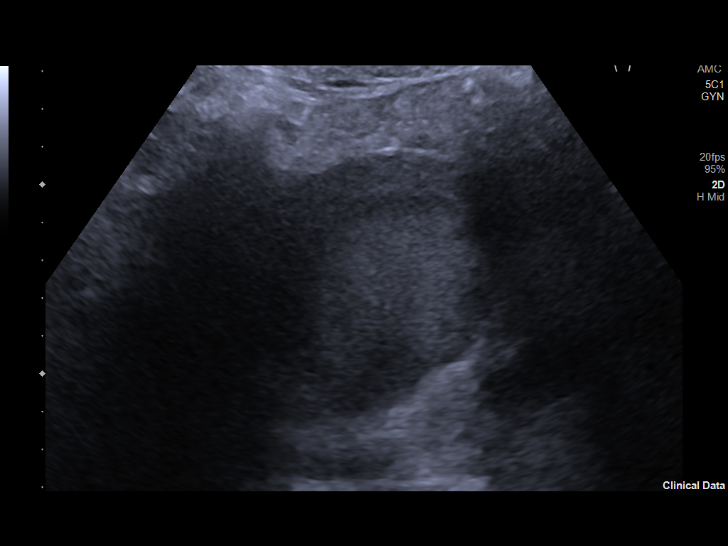
[im 8/41]
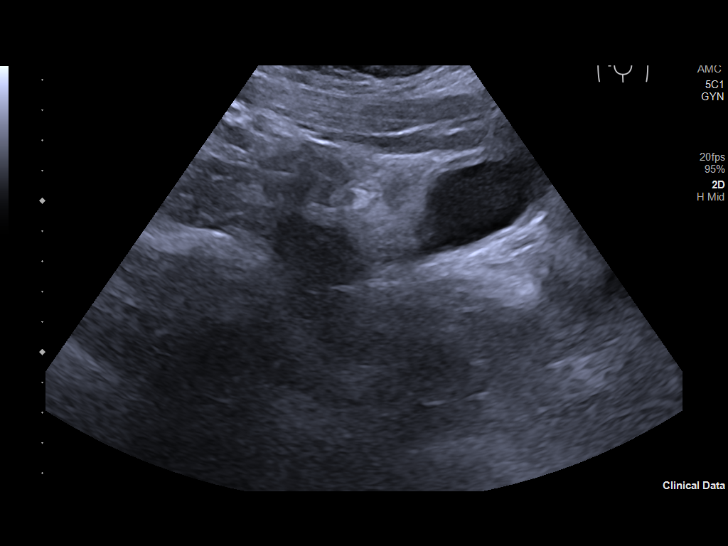
[im 11/41]
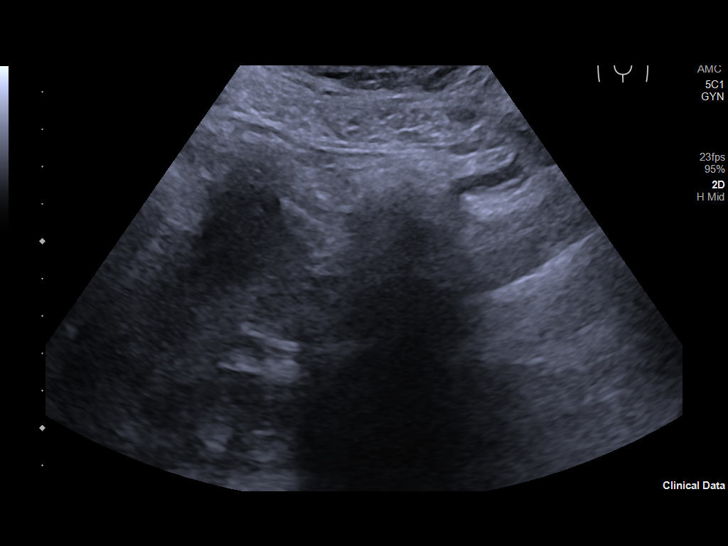
[im 14/41]
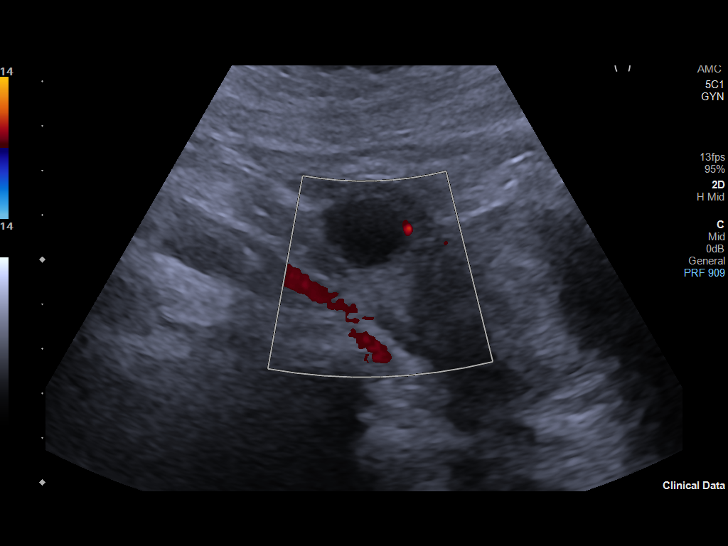
[im 17/41]
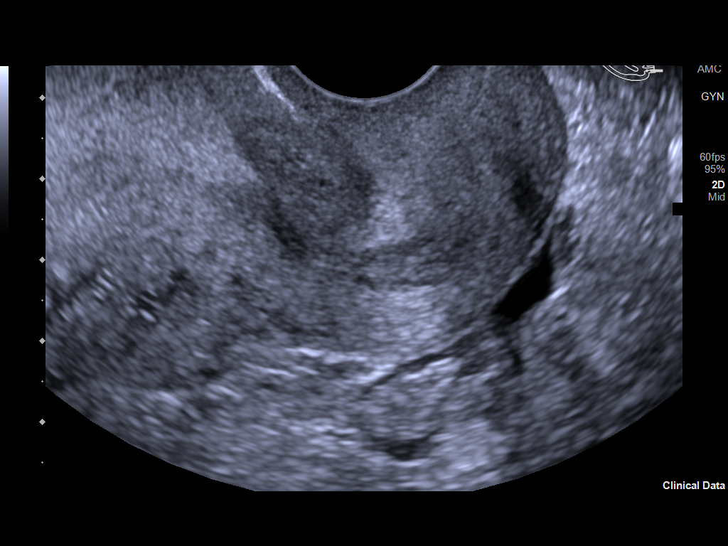
[im 21/41]
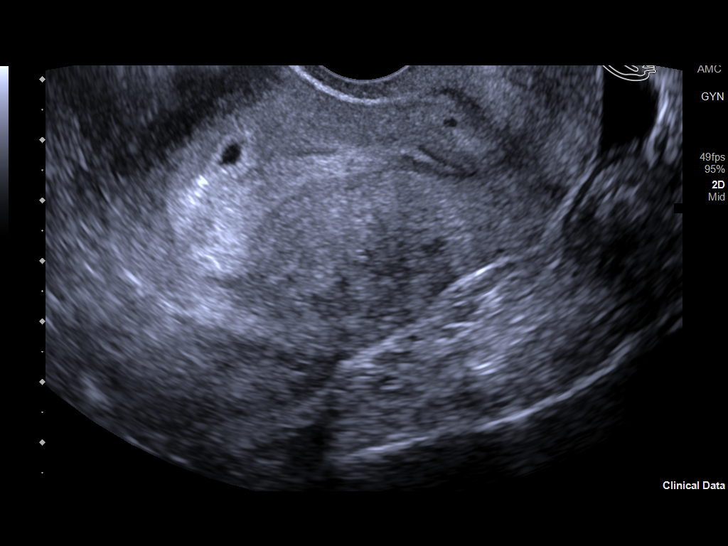
[im 24/41]
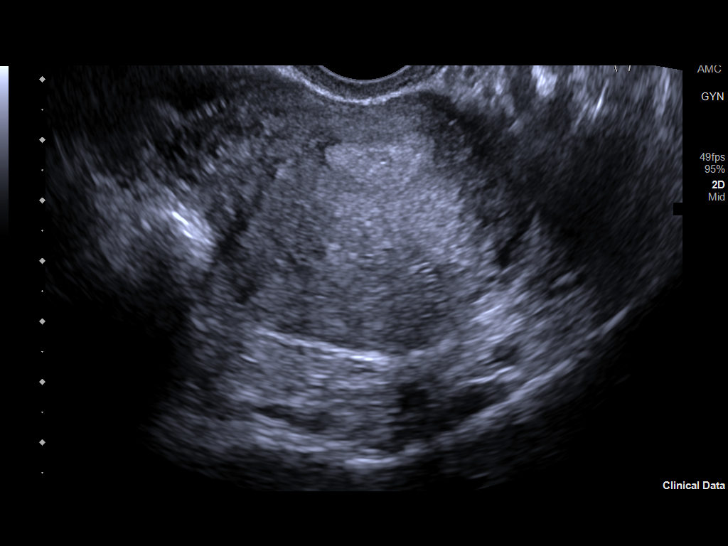
[im 27/41]
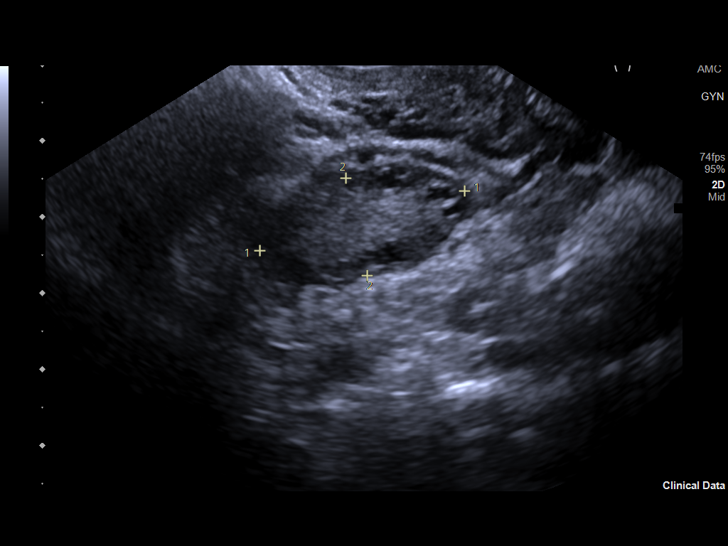
[im 30/41]
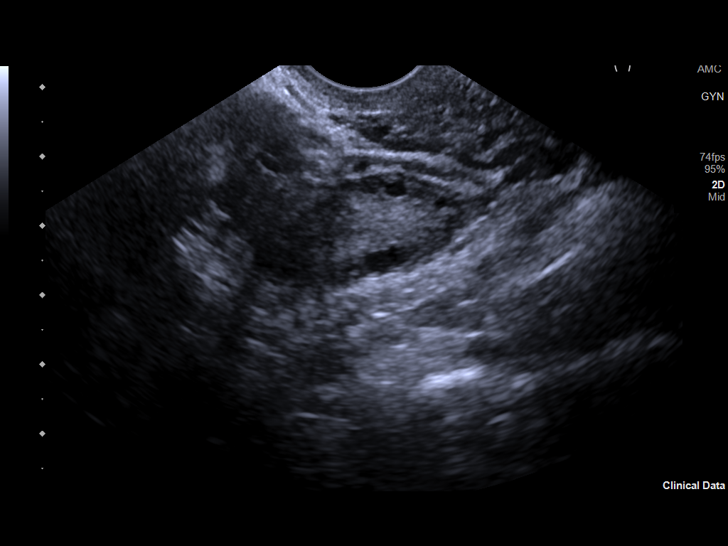
[im 33/41]
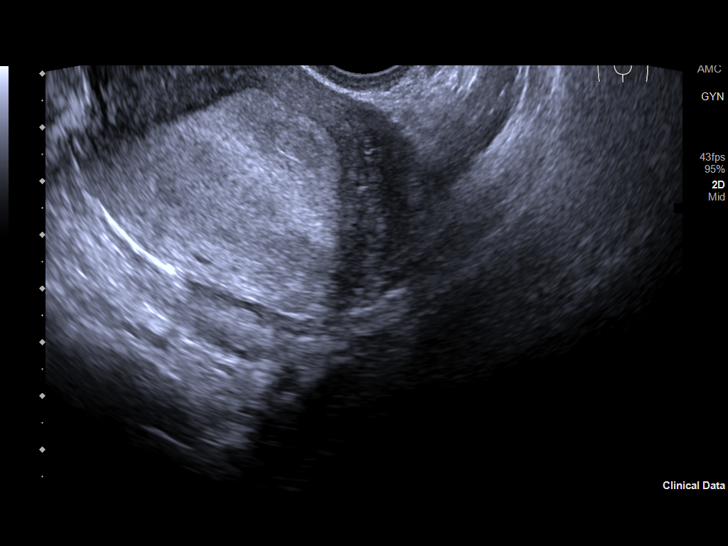
[im 36/41]
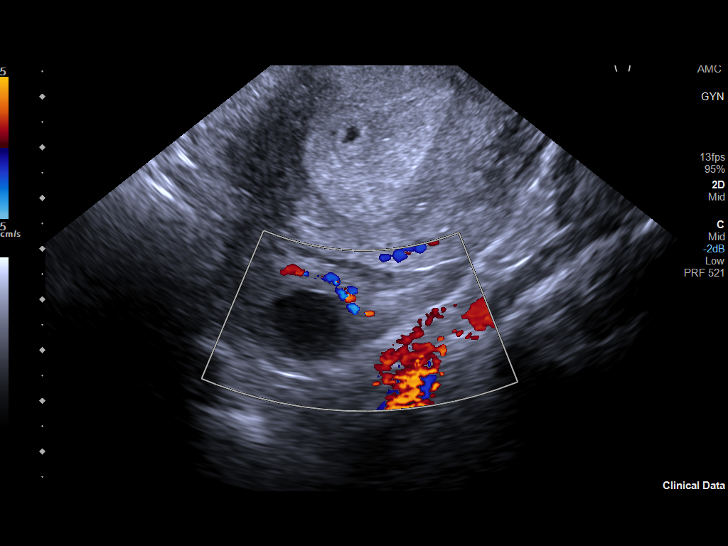
[im 39/41]
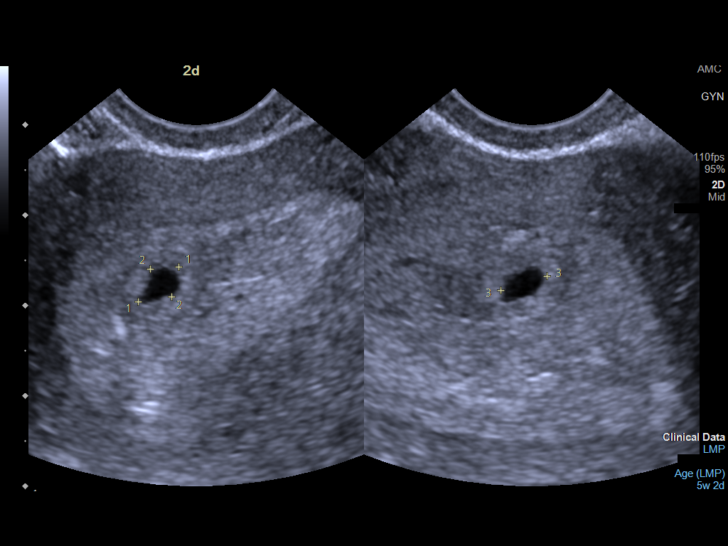

[13 of 28 positions shown; findings below may reference images not displayed]

FINDINGS: Intrauterine gestational sac: Single

Yolk sac:  Not visualized

Embryo:  Not visualized

Cardiac Activity: Not visualized

MSD: 5.1 mm   5 w   2 d

Subchorionic hemorrhage:  None visualized.

Maternal uterus/adnexae: Uterus otherwise unremarkable. Peripherally
positioned follicles are present in both ovaries.
IMPRESSION: 1. Single gestational sac identified. Estimated gestational age is 5
weeks 2 days based on mean sac diameter. No fetal pole or yolk sac
identified at this time. Differential considerations include:
Pregnancy too early to characterize, blighted ovum, or pseudo
gestational sac from ectopic pregnancy. Recommend close clinical
follow-up, short interval repeat exam, and serial beta HCG levels.
2. Peripherally oriented follicles in both ovaries. Question
polycystic ovarian syndrome.

## 2024-08-21 ENCOUNTER — Other Ambulatory Visit: Payer: Self-pay | Admitting: Family Medicine

## 2024-08-21 DIAGNOSIS — Z3481 Encounter for supervision of other normal pregnancy, first trimester: Secondary | ICD-10-CM

## 2024-11-16 ENCOUNTER — Encounter: Payer: Self-pay | Admitting: *Deleted
